# Patient Record
Sex: Female | Born: 1995 | Race: Black or African American | Hispanic: No | Marital: Single | Smoking: Never smoker
Health system: Southern US, Community
[De-identification: ages and names within clinical notes are randomized; demographics above are authoritative.]

## PROBLEM LIST (undated history)

## (undated) DIAGNOSIS — F3181 Bipolar II disorder: Secondary | ICD-10-CM

## (undated) DIAGNOSIS — L309 Dermatitis, unspecified: Secondary | ICD-10-CM

## (undated) HISTORY — DX: Dermatitis, unspecified: L30.9

## (undated) HISTORY — DX: Bipolar II disorder: F31.81

---
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
## 2021-10-15 NOTE — Progress Notes (Addendum)
? ?New Patient Office Visit ? ?Subjective:  ?Patient ID: Sheryl Pittman, female    DOB: April 08, 1996  Age: 26 y.o. MRN: [id number] ? ?CC:  ?Chief Complaint  ?Patient presents with  ? Annual Exam  ?  CPE, pt is fasting   ? ? ?HPI ?Sheryl Pittman presents for new patient visit to establish care.  Introduced to Publishing rights manager role and practice setting.  All questions answered.  Discussed provider/patient relationship and expectations. ? ?She endorses a rash on her neck for the last year. She does note some itching, but describes it more as dry. She uses cocoa butter daily. She has a hsitory of eczema on her elbow as a kid.  ? ?She has a history of mood disorder and was seeing psychiatry and a therapist in the past.  She states that she was being treated for bipolar disorder type II, however she was never tested for it.  She was taking Lamictal, wellbutrin, abilify. Since starting abilify she had started getting bad vertigo. She is now off these medications and still has some vertigo. She is interetesd in starting therapy again, however is having trouble finding someone with her insurance. She endorses trouble sleeping. She endorses mood swings. Describes periods of a lot of energy and does art and projects, then she will have periods where she has trouble getting out of bed and cleaning.  Denies SI/HI. ? ? ?  10/16/2021  ? 11:22 AM  ?Depression screen PHQ 2/9  ?Decreased Interest 2  ?Down, Depressed, Hopeless 2  ?PHQ - 2 Score 4  ?Altered sleeping 3  ?Tired, decreased energy 3  ?Change in appetite 3  ?Feeling bad or failure about yourself  2  ?Trouble concentrating 3  ?Moving slowly or fidgety/restless 3  ?Suicidal thoughts 0  ?PHQ-9 Score 21  ?Difficult doing work/chores Extremely dIfficult  ? ? ?  10/16/2021  ? 11:22 AM  ?GAD 7 : Generalized Anxiety Score  ?Nervous, Anxious, on Edge 3  ?Control/stop worrying 3  ?Worry too much - different things 3  ?Trouble relaxing 2  ?Restless 2  ?Easily annoyed or  irritable 2  ?Afraid - awful might happen 2  ?Total GAD 7 Score 17  ?Anxiety Difficulty Somewhat difficult  ? ?Past Medical History:  ?Diagnosis Date  ? Bipolar 2 disorder (HCC)   ? Eczema   ? ? ?History reviewed. No pertinent surgical history. ? ?Family History  ?Problem Relation Age of Onset  ? Alcohol abuse Mother   ? Anxiety disorder Mother   ? Depression Mother   ? Stroke Paternal Grandmother   ? Anxiety disorder Sister   ? Asthma Sister   ? Depression Sister   ? ? ?Social History  ? ?Socioeconomic History  ? Marital status: Single  ?  Spouse name: Not on file  ? Number of children: Not on file  ? Years of education: Not on file  ? Highest education level: Not on file  ?Occupational History  ? Not on file  ?Tobacco Use  ? Smoking status: Never  ? Smokeless tobacco: Never  ?Vaping Use  ? Vaping Use: Every day  ? Substances: Nicotine  ?Substance and Sexual Activity  ? Alcohol use: Never  ? Drug use: Never  ? Sexual activity: Yes  ?  Birth control/protection: None  ?Other Topics Concern  ? Not on file  ?Social History Narrative  ? Not on file  ? ?Social Determinants of Health  ? ?Financial Resource Strain: Not on file  ?Food Insecurity:  Not on file  ?Transportation Needs: Not on file  ?Physical Activity: Not on file  ?Stress: Not on file  ?Social Connections: Not on file  ?Intimate Partner Violence: Not on file  ? ? ?ROS ?Review of Systems  ?Constitutional:  Positive for fatigue.  ?HENT: Negative.    ?Eyes: Negative.   ?Respiratory: Negative.    ?Cardiovascular: Negative.   ?Gastrointestinal: Negative.   ?Genitourinary: Negative.   ?Musculoskeletal: Negative.   ?Skin:  Positive for rash.  ?Neurological:  Positive for dizziness. Negative for headaches.  ?Psychiatric/Behavioral:  Positive for dysphoric mood and sleep disturbance.   ? ?Objective:  ? ?Today's Vitals: BP 120/80 (BP Location: Left Arm, Patient Position: Sitting, Cuff Size: Normal)   Pulse 85   Temp 97.8 ?F (36.6 ?C) (Temporal)   Resp 16   Ht 5\' 7"   (1.702 m)   Wt 122 lb 3.2 oz (55.4 kg)   LMP 09/27/2021 (Within Days)   SpO2 99%   BMI 19.14 kg/m?  ? ?Physical Exam ?Vitals and nursing note reviewed.  ?Constitutional:   ?   General: She is not in acute distress. ?   Appearance: Normal appearance.  ?HENT:  ?   Head: Normocephalic and atraumatic.  ?   Right Ear: Tympanic membrane, ear canal and external ear normal.  ?   Left Ear: Tympanic membrane, ear canal and external ear normal.  ?   Nose: Nose normal.  ?   Mouth/Throat:  ?   Mouth: Mucous membranes are moist.  ?   Pharynx: Oropharynx is clear.  ?Eyes:  ?   Conjunctiva/sclera: Conjunctivae normal.  ?Cardiovascular:  ?   Rate and Rhythm: Normal rate and regular rhythm.  ?   Pulses: Normal pulses.  ?   Heart sounds: Normal heart sounds.  ?Pulmonary:  ?   Effort: Pulmonary effort is normal.  ?   Breath sounds: Normal breath sounds.  ?Abdominal:  ?   General: Bowel sounds are normal.  ?   Palpations: Abdomen is soft.  ?   Tenderness: There is no abdominal tenderness.  ?Musculoskeletal:     ?   General: Normal range of motion.  ?   Cervical back: Normal range of motion. No tenderness.  ?   Right lower leg: No edema.  ?   Left lower leg: No edema.  ?   Comments: Strength 5/5 in bilateral upper and lower extremities   ?Lymphadenopathy:  ?   Cervical: No cervical adenopathy.  ?Skin: ?   General: Skin is warm and dry.  ?   Comments: Scaly, red dry rash to right lateral and posterior neck  ?Neurological:  ?   General: No focal deficit present.  ?   Mental Status: She is alert and oriented to person, place, and time.  ?   Cranial Nerves: No cranial nerve deficit.  ?   Coordination: Coordination normal.  ?   Gait: Gait normal.  ?Psychiatric:     ?   Mood and Affect: Mood normal.     ?   Behavior: Behavior normal.     ?   Thought Content: Thought content normal.     ?   Judgment: Judgment normal.  ? ? ?Assessment & Plan:  ? ?Problem List Items Addressed This Visit   ? ?  ? Musculoskeletal and Integument  ? Eczema  ?   Rash noted to the right lateral and posterior neck.  Skin is dry with some scaling.  We will treat with triamcinolone cream twice a day  as needed.  Follow-up if symptoms worsen or with any concerns. ? ?  ?  ?  ? Other  ? Bipolar 2 disorder (HCC)  ?  She has a history of being treated for bipolar 2 disorder, however she states that she was never officially diagnosed.  She was treated with Lamictal, Abilify, and Wellbutrin.  She states that her symptoms have gotten worse again after being off medication and stopping therapy and would like a referral for this.  PHQ-9 is a 21 and GAD-7 is a 17 today.  Referral placed to psychiatry today.  She currently denies SI/HI.  Follow-up with any concerns ? ?  ?  ? Relevant Orders  ? Ambulatory referral to Psychiatry  ? Encounter for initial prescription of injectable contraceptive  ?  Depo-Provera injection given today for menorrhagia.  Follow-up in 3 months for next injection ? ?  ?  ? Relevant Orders  ? POCT urine pregnancy (Completed)  ? Menorrhagia with regular cycle  ?  She has a history of heavy menstrual cycles with cramping.  She was on Depo-Provera for this in the past which had worked.  She would like to get back on this medication.  If urine pregnancy is negative will give first dose today. ? ?  ?  ? ?Other Visit Diagnoses   ? ? Routine general medical examination at a health care facility    -  Primary  ? Maintenance reviewed and updated.  Pap done today, tetanus booster updated.  Check CMP, CBC.  Follow-up 1 year  ? Relevant Orders  ? CBC with Differential/Platelet (Completed)  ? Comprehensive metabolic panel (Completed)  ? Screening for cervical cancer      ? Pap done today.  Checking for STD is off Pap per patient request.  ? Relevant Orders  ? Cytology - PAP  ? Screening, lipid      ? Check baseline lipid panel.  ? Relevant Orders  ? Lipid panel (Completed)  ? Encounter for hepatitis C screening test for low risk patient      ? Screen for hepatitis C today.  ?  Relevant Orders  ? Hepatitis C antibody (Completed)  ? Screening for HIV (human immunodeficiency virus)      ? Screen for HIV today  ? Relevant Orders  ? HIV Antibody (routine testing w rflx) (Completed)  ? Need for

## 2021-10-16 ENCOUNTER — Inpatient Hospital Stay (HOSPITAL_COMMUNITY): Admit: 2021-10-16 | Payer: BLUE CROSS/BLUE SHIELD

## 2021-10-16 ENCOUNTER — Ambulatory Visit (INDEPENDENT_AMBULATORY_CARE_PROVIDER_SITE_OTHER): Payer: BLUE CROSS/BLUE SHIELD | Admitting: Nurse Practitioner

## 2021-10-16 ENCOUNTER — Encounter: Payer: Self-pay | Admitting: Nurse Practitioner

## 2021-10-16 VITALS — BP 120/80 | HR 85 | Temp 97.8°F | Resp 16 | Ht 67.0 in | Wt 122.2 lb

## 2021-10-16 DIAGNOSIS — Z124 Encounter for screening for malignant neoplasm of cervix: Secondary | ICD-10-CM

## 2021-10-16 DIAGNOSIS — Z0000 Encounter for general adult medical examination without abnormal findings: Secondary | ICD-10-CM

## 2021-10-16 DIAGNOSIS — Z23 Encounter for immunization: Secondary | ICD-10-CM | POA: Diagnosis not present

## 2021-10-16 DIAGNOSIS — N920 Excessive and frequent menstruation with regular cycle: Secondary | ICD-10-CM | POA: Diagnosis not present

## 2021-10-16 DIAGNOSIS — Z13220 Encounter for screening for lipoid disorders: Secondary | ICD-10-CM

## 2021-10-16 DIAGNOSIS — L309 Dermatitis, unspecified: Secondary | ICD-10-CM

## 2021-10-16 DIAGNOSIS — Z3009 Encounter for other general counseling and advice on contraception: Secondary | ICD-10-CM | POA: Insufficient documentation

## 2021-10-16 DIAGNOSIS — Z30013 Encounter for initial prescription of injectable contraceptive: Secondary | ICD-10-CM | POA: Diagnosis not present

## 2021-10-16 DIAGNOSIS — Z114 Encounter for screening for human immunodeficiency virus [HIV]: Secondary | ICD-10-CM

## 2021-10-16 DIAGNOSIS — F3181 Bipolar II disorder: Secondary | ICD-10-CM | POA: Diagnosis not present

## 2021-10-16 DIAGNOSIS — Z1159 Encounter for screening for other viral diseases: Secondary | ICD-10-CM

## 2021-10-16 LAB — CBC WITH DIFFERENTIAL/PLATELET
Basophils Absolute: 0.1 10*3/uL (ref 0.0–0.1)
Basophils Relative: 0.7 % (ref 0.0–3.0)
Eosinophils Absolute: 0.4 10*3/uL (ref 0.0–0.7)
Eosinophils Relative: 5.2 % — ABNORMAL HIGH (ref 0.0–5.0)
HCT: 40.8 % (ref 36.0–46.0)
Hemoglobin: 13.2 g/dL (ref 12.0–15.0)
Lymphocytes Relative: 30.8 % (ref 12.0–46.0)
Lymphs Abs: 2.3 10*3/uL (ref 0.7–4.0)
MCHC: 32.4 g/dL (ref 30.0–36.0)
MCV: 79 fl (ref 78.0–100.0)
Monocytes Absolute: 0.5 10*3/uL (ref 0.1–1.0)
Monocytes Relative: 6.6 % (ref 3.0–12.0)
Neutro Abs: 4.2 10*3/uL (ref 1.4–7.7)
Neutrophils Relative %: 56.7 % (ref 43.0–77.0)
Platelets: 175 10*3/uL (ref 150.0–400.0)
RBC: 5.17 Mil/uL — ABNORMAL HIGH (ref 3.87–5.11)
RDW: 14.5 % (ref 11.5–15.5)
WBC: 7.4 10*3/uL (ref 4.0–10.5)

## 2021-10-16 LAB — LIPID PANEL
Cholesterol: 161 mg/dL (ref 0–200)
HDL: 64.2 mg/dL (ref >39.00)
LDL Cholesterol: 85 mg/dL (ref 0–99)
NonHDL: 96.43
Total CHOL/HDL Ratio: 3
Triglycerides: 55 mg/dL (ref 0.0–149.0)
VLDL: 11 mg/dL (ref 0.0–40.0)

## 2021-10-16 LAB — COMPREHENSIVE METABOLIC PANEL
ALT: 10 U/L (ref 0–35)
AST: 20 U/L (ref 0–37)
Albumin: 4.7 g/dL (ref 3.5–5.2)
Alkaline Phosphatase: 63 U/L (ref 39–117)
BUN: 16 mg/dL (ref 6–23)
CO2: 22 mEq/L (ref 19–32)
Calcium: 9.2 mg/dL (ref 8.4–10.5)
Chloride: 105 mEq/L (ref 96–112)
Creatinine, Ser: 0.9 mg/dL (ref 0.40–1.20)
GFR: 90.21 mL/min (ref >60.00)
Glucose, Bld: 81 mg/dL (ref 70–99)
Potassium: 4 mEq/L (ref 3.5–5.1)
Sodium: 136 mEq/L (ref 135–145)
Total Bilirubin: 0.5 mg/dL (ref 0.2–1.2)
Total Protein: 7.8 g/dL (ref 6.0–8.3)

## 2021-10-16 LAB — POCT URINE PREGNANCY: Preg Test, Ur: NEGATIVE

## 2021-10-16 MED ORDER — TRIAMCINOLONE ACETONIDE 0.1 % EX CREA: 1.0000 "application " | TOPICAL_CREAM | Freq: Two times a day (BID) | CUTANEOUS | 1 refills | Status: AC

## 2021-10-16 MED ORDER — MEDROXYPROGESTERONE ACETATE 150 MG/ML IM SUSP
150.0000 mg | Freq: Once | INTRAMUSCULAR | Status: AC
  Administered 2021-10-16: 150 mg via INTRAMUSCULAR

## 2021-10-16 NOTE — Assessment & Plan Note (Signed)
She has a history of heavy menstrual cycles with cramping.  She was on Depo-Provera for this in the past which had worked.  She would like to get back on this medication.  If urine pregnancy is negative will give first dose today. ?

## 2021-10-16 NOTE — Assessment & Plan Note (Signed)
Depo-Provera injection given today for menorrhagia.  Follow-up in 3 months for next injection ?

## 2021-10-16 NOTE — Patient Instructions (Signed)
It was great to see you! ? ?We are checking your labs today and will let you know the results via mychart/phone.  ? ?I am placing a referral to psychiatry for therapy and medications if indicated.  ? ?Let's follow-up in 1 year, sooner if you have concerns. ? ?If a referral was placed today, you will be contacted for an appointment. Please note that routine referrals can sometimes take up to 3-4 weeks to process. Please call our office if you haven't heard anything after this time frame. ? ?Take care, ? ?Rodman Pickle, NP ? ?

## 2021-10-16 NOTE — Assessment & Plan Note (Signed)
Rash noted to the right lateral and posterior neck.  Skin is dry with some scaling.  We will treat with triamcinolone cream twice a day as needed.  Follow-up if symptoms worsen or with any concerns. ?

## 2021-10-16 NOTE — Assessment & Plan Note (Addendum)
She has a history of being treated for bipolar 2 disorder, however she states that she was never officially diagnosed.  She was treated with Lamictal, Abilify, and Wellbutrin.  She states that her symptoms have gotten worse again after being off medication and stopping therapy and would like a referral for this.  PHQ-9 is a 21 and GAD-7 is a 17 today.  Referral placed to psychiatry today.  She currently denies SI/HI.  Follow-up with any concerns ?

## 2021-10-16 NOTE — Addendum Note (Signed)
Addended by: Rodman Pickle A on: 10/16/2021 12:46 PM ? ? Modules accepted: Orders ? ?

## 2021-10-17 LAB — HEPATITIS C ANTIBODY
Hepatitis C Ab: NONREACTIVE
SIGNAL TO CUT-OFF: 0.03 (ref <1.00)

## 2021-10-17 LAB — HIV ANTIBODY (ROUTINE TESTING W REFLEX): HIV 1&2 Ab, 4th Generation: NONREACTIVE

## 2021-10-23 LAB — CYTOLOGY - PAP
Chlamydia: NEGATIVE
Comment: NEGATIVE
Comment: NEGATIVE
Comment: NEGATIVE
Comment: NORMAL
Diagnosis: NEGATIVE
HSV1: NEGATIVE
HSV2: NEGATIVE
Neisseria Gonorrhea: NEGATIVE
Trichomonas: NEGATIVE

## 2022-01-17 ENCOUNTER — Ambulatory Visit: Payer: BLUE CROSS/BLUE SHIELD

## 2024-01-06 ENCOUNTER — Encounter: Payer: Self-pay | Admitting: Nurse Practitioner

## 2024-02-12 ENCOUNTER — Encounter: Payer: Self-pay | Admitting: Nurse Practitioner
